# Patient Record
Sex: Female | Born: 1960 | Hispanic: No | Marital: Married | Smoking: Never smoker
Health system: Southern US, Community
[De-identification: ages and names within clinical notes are randomized; demographics above are authoritative.]

---

## 2019-09-24 ENCOUNTER — Emergency Department (HOSPITAL_COMMUNITY): Payer: Commercial Managed Care - PPO

## 2019-09-24 ENCOUNTER — Other Ambulatory Visit: Payer: Self-pay

## 2019-09-24 ENCOUNTER — Encounter (HOSPITAL_COMMUNITY): Payer: Self-pay | Admitting: Emergency Medicine

## 2019-09-24 ENCOUNTER — Emergency Department (HOSPITAL_COMMUNITY)
Admission: EM | Admit: 2019-09-24 | Discharge: 2019-09-24 | Disposition: A | Discharge status: Home / Self Care | Payer: Commercial Managed Care - PPO | Attending: Emergency Medicine | Admitting: Emergency Medicine

## 2019-09-24 DIAGNOSIS — Y999 Unspecified external cause status: Secondary | ICD-10-CM | POA: Insufficient documentation

## 2019-09-24 DIAGNOSIS — Y92410 Unspecified street and highway as the place of occurrence of the external cause: Secondary | ICD-10-CM | POA: Diagnosis not present

## 2019-09-24 DIAGNOSIS — Y939 Activity, unspecified: Secondary | ICD-10-CM | POA: Insufficient documentation

## 2019-09-24 DIAGNOSIS — S2241XA Multiple fractures of ribs, right side, initial encounter for closed fracture: Secondary | ICD-10-CM | POA: Insufficient documentation

## 2019-09-24 DIAGNOSIS — S299XXA Unspecified injury of thorax, initial encounter: Secondary | ICD-10-CM | POA: Diagnosis present

## 2019-09-24 MED ORDER — DICLOFENAC EPOLAMINE 1.3 % EX PTCH
1.0000 | MEDICATED_PATCH | Freq: Two times a day (BID) | CUTANEOUS | Status: DC
  Administered 2019-09-24: 1 via TRANSDERMAL
  Filled 2019-09-24: qty 1

## 2019-09-24 NOTE — Discharge Instructions (Signed)
As discussed, with your newly diagnosed rib fractures you are likely to experience soreness for the next week, the symptoms should improve over the following months. For pain control please use Tylenol, ibuprofen, and medicated patches including the ingredients lidocaine and methyl salicylate.  These are available at any local pharmacy. Return here for concerning changes in your condition.
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
